# Patient Record
Sex: Female | Born: 1950 | Hispanic: No
Health system: Western US, Academic
[De-identification: ages and names within clinical notes are randomized; demographics above are authoritative.]

---

## 2018-06-10 ENCOUNTER — Ambulatory Visit: Admit: 2018-06-10 | Discharge: 2018-06-10 | Payer: MEDICAID | Attending: Audiologist

## 2018-06-10 ENCOUNTER — Ambulatory Visit
Admit: 2018-06-10 | Discharge: 2018-06-10 | Payer: MEDICAID | Attending: Student in an Organized Health Care Education/Training Program

## 2018-06-10 DIAGNOSIS — H8190 Unspecified disorder of vestibular function, unspecified ear: Secondary | ICD-10-CM

## 2018-06-10 DIAGNOSIS — H903 Sensorineural hearing loss, bilateral: Secondary | ICD-10-CM

## 2018-06-10 LAB — AUDBASE COMPREHENSIVE HEARING
Aoustic Reflex @1000Hz Probe L: 400
Aoustic Reflex @1000Hz Probe L: 95
Aoustic Reflex @1000Hz Probe R: 400
Aoustic Reflex @1000Hz Probe R: 85
Aoustic Reflex @2000Hz Probe L: 400
Aoustic Reflex @2000Hz Probe L: 95
Aoustic Reflex @2000Hz Probe R: 400
Aoustic Reflex @2000Hz Probe R: 90
Aoustic Reflex @500Hz Probe Le: 400
Aoustic Reflex @500Hz Probe Le: 95
Aoustic Reflex @500Hz Probe Ri: 400
Aoustic Reflex @500Hz Probe Ri: 95
Aoustic Reflex Decay @1000Hz P: 0
Aoustic Reflex Decay @1000Hz P: 0
Aoustic Reflex Decay @1000Hz P: 0
Aoustic Reflex Decay @1000Hz P: 0
Aoustic Reflex Decay @2000Hz P: 0
Aoustic Reflex Decay @2000Hz P: 0
Aoustic Reflex Decay @2000Hz P: 0
Aoustic Reflex Decay @2000Hz P: 0
Aoustic Reflex Decay @4000Hz P: 0
Aoustic Reflex Decay @4000Hz P: 0
Aoustic Reflex Decay @4000Hz P: 0
Aoustic Reflex Decay @4000Hz P: 0
Aoustic Reflex Decay @500Hz Pr: 0
Aoustic Reflex Decay @500Hz Pr: 0
Aoustic Reflex Decay @500Hz Pr: 0
Aoustic Reflex Decay @500Hz Pr: 0
Aoustic Reflex Decay @Noise Pr: 0
Aoustic Reflex Decay @Noise Pr: 0
Aoustic Reflex Decay @Noise Pr: 0
Aoustic Reflex Decay @Noise Pr: 0
Audiogram Date: 20191122
Audiogram Specific Frequency T: 0
Audiogram Time: 132448
Bne Unmasked Right @ 1000Hz: 15
Bne Unmasked Right @ 2000Hz: 15
Bne Unmasked Right @ 250Hz: 5
Bne Unmasked Right @ 4000Hz: 20
Bne Unmasked Right @ 500Hz: 15
Ear Canal Volume Left Ear: 1.284
Ear Canal Volume Right Ear: 1.234
Peak Admittance Left Ear: 0.716
Peak Admittance Right Ear: 0.622
Peak Pressure Left Ear: -1
Peak Pressure Right Ear: -1
Probe Tone Frequency Hz Left E: 226
Probe Tone Frequency Hz Right: 226
Pure Tone Air Unmasked Left @: 10
Pure Tone Air Unmasked Left @: 10
Pure Tone Air Unmasked Left @: 10
Pure Tone Air Unmasked Left @: 10
Pure Tone Air Unmasked Left @: 15
Pure Tone Air Unmasked Left @: 20
Pure Tone Air Unmasked Left @: 20
Pure Tone Air Unmasked Left @: 20
Pure Tone Air Unmasked Left @: 25
Pure Tone Air Unmasked Left: 20
Pure Tone Air Unmasked Right @: 10
Pure Tone Air Unmasked Right: 10
Pure Tone Air Unmasked Right: 10
Pure Tone Air Unmasked Right: 10
Pure Tone Air Unmasked Right: 10
Pure Tone Air Unmasked Right: 10
Pure Tone Air Unmasked Right: 15
Pure Tone Air Unmasked Right: 15
Pure Tone Air Unmasked Right: 20
Pure Tone Air Unmasked Right: 20
Pure Tone Average Air Left: 13
Pure Tone Average Air Right: 0
Pure Tone Average Air Right: 13
Pure Tone Average Bne Right: 15
Pure Tone Bne Unmasked Left @: 15
Pure Tone Bne Unmasked Left @: 15
Pure Tone Bne Unmasked Left @: 15
Pure Tone Bne Unmasked Left @: 20
Pure Tone Bne Unmasked Left @: 5
Pure Tone Tinnitus Matching Fr: 0
Pure Tone Tinnitus Matching Fr: 0
SRT Air Left: 20
SRT Air Right: 25
Speech Test Date: 20191122
Speech Test Time: 132449
Tymp Grdnt Left Ear: 80
Tymp Grdnt Right Ear: 105
Tympanogram Date: 20191122
Tympanogram Time: 132448
Word Rec Left Trial 1 Percenta: 100
Word Rec Left Trial 1 dB Level: 65
Word Rec Right Trial 1 Percent: 100
Word Rec Right Trial 1 dB Leve: 65
~~LOC~~ AUD PURETONEAVG_AIR_L_FRE: 0
~~LOC~~ AUD PURETONEAVG_BNE_L_FRE: 0
~~LOC~~ AUD PURETONEAVG_BNE_R_FRE: 0
~~LOC~~ AUD PURETONEAVG_SFAID_FRE: 0
~~LOC~~ AUD PURETONEAVG_SFUNAID_F: 0

## 2018-06-10 NOTE — Progress Notes (Signed)
Adult Audiological Evaluation    Primary Physician: Rocky CraftsMimi Y Ogawa, MD  Referring Physician: Aniceto BossMacDonald, Michael Rain*   Diagnosis: Sensorineural hearing loss (SNHL), bilateral  Date of Service: 06/10/2018  Name of Provider: Ollen BargesBrooke I Lewandowski, AUD    HISTORY  Family Members/Caregivers/Associates in Attendance: none  Interpreter: Portugese Alonza Bogusibelli Pacheco     Primary Complaint: Gail Andrews, age 67, was seen today for a hearing evaluation in conjunction with [facility]. Her dizziness episodes occur about once per month in the mornings when moving her head up and down, which can cause difficulty walking. She describes the dizziness as her head spinning and becomes very sound sensitive during episodes, which last hours-days and can occur multiple days in a row.    Additional otologic history is as follows:  Previous Hearing Evaluation: No  Noise exposure: No  Ear/infections/drainage: No  Ear fullness Pressure: No  Ear Pain: 0  Ear Surgery: No  Tinnitus: No  Family History of Hearing Loss: No  Hearing Aid Experience: No  Communication impact: No  Headache: No    RESULTS:  Otoscopic Evaluation:         Right Ear:  TM visualized and unremarkable  Left Ear:  TM visualized and unremarkable         Audiologic Evaluation: obtained today using insert earphones, retested with TDH headphones (see attached audiogram):   Right Ear: hearing sensitivity within normal to borderline normal limits from 250Hz -8kHz   Left Ear: hearing sensitivity within normal to borderline normal limits from 250Hz -8kHz    Speech Reception Threshold (SRT):           Right Ear: 25 dBHL  Left Ear: 20 dBHL    Speech Recognition:       Right Ear: 100% at 65 dBHL, indicating excellent word recognition scores  Left Ear: 100% at 65 dBHL, indicating excellent word recognition scores    IMMITTANCE:  Tympanometry  Right Ear: revealed normal ear canal volume, pressure and compliance consistent with normal  eardrum mobility.      Left Ear: revealed normal ear canal volume, pressure and compliance consistent with normal eardrum mobility.         Acoustic Reflexes: Ipsilaterally stimulated acoustic reflexes were present from [phone] Hz for right and left ears.  This is consistent with audiometric results. ART was completed using handheld probe tip. CNT contralateral thresholds due to poor acoustic seal and excessive needle artifact.    IMPRESSIONS:  Patient was seen today for a hearing evaluation in conjunction with [facility]. Audiometric results revealed hearing sensitivity within normal to borderline normal limits from 250Hz -8kHz in both ears. Word recognition is excellent bilaterally. Tympanometry is consistent with normal TM mobility in both ears. Acoustic reflex thresholds are consistent with audiometric results.    RECOMMENDATIONS:  1. Follow up with referring physician  2. Re-evaluation of hearing in 1-2 years to monitor progression of hearing.     Ollen BargesBrooke I Lewandowski, Au.D. CCC-A  Audiologist

## 2018-06-10 NOTE — Progress Notes (Signed)
VESTIBULAR / BALANCE EVALUATION    Date of Service: 06/10/2018  Referring MD: Aniceto BossMacDonald, Michael Rain*  Name of Provider: Konrad Felixachel A Smith, AUD     Name of Patient: Gail Andrews   Patient Date of Birth: 01/17/1951  Patient MRN: [id number]     Pt accompanied by:  Daughter   Primary language:  Portuguese   Interpreter: Hillary Bowibelli      HISTORY:  Gail CoolRosalina Andrews was [facility] in the Division of Audiology for a vestibular evaluation.     Problem: Dizziness   Date of onset: ~5 years ago       Symptoms: Patient reports episodes of dizziness that occurs about once a month in the mornings. The dizziness occurs when moving her head up and down and she experiences difficulty walking. She describes the dizziness as her head spinning and becomes very sound sensitive during episodes. She must lay in bed and it is difficult to move around. The spinning lasts hours to days and can occur multiple days in a row. She endorses nausea during episodes.       Duration: Days   Frequency: Once a month to every two weeks   In between episodes: Normal   True vertigo: Yes   Hx of falls: Yes     Precipitating factors: Turning either to the right or left, looking down or up, stress   Alleviates symptoms: Rest     Migraine/Headache: Headaches during dizziness episodes     Photophobia/Phonophobia: Sensitivity to sound during episodes   Motion Sensitivity: No     Previous treatments:  Meclizine, helps her to feel better but takes time to work      Hearing loss: No   Tinnitus: No   Aural Pressure: No     DIZZINESS HANDICAP INVENTORY:   The Dizziness Handicap Inventory [facility](DHI) is a useful measure designed to evaluate the self-perceived handicapping effects imposed by a patient's dizziness. Ms. Gail AbrahamsSouza scored a 7284 on the DHI, suggesting a severe handicap.       AUDIOLOGIC EVALUATION: 06/10/18 hearing sensitivity within normal limits bilaterally with excellent WRS bilaterally. Tympanometry was consistent with normal tympanic membrane  mobility bilaterally. Present ipsilateral acoustic reflexes bilaterally.        VESTIBULAR RESULTS: See below for test details. Vestibular data available upon request, or can be viewed in SCANNED DOCUMENTS.      IMPRESSIONS:  1. PERIPHERAL: Normal VNG, vHIT, and rotary chair. No objective indications of peripheral vestibular system involvement noted.  2. CENTRAL: Abnormal visual pursuit, saccades, optokinetics, and abnormal suppression during calorics are central indications. Of note, these results may be affected by attention to task and peripheral vision. No other indications of central vestibulo-ocular pathway involvement noted.   3. BPPV: There were no objective indications for Benign Paroxysmal Positional Vertigo.      RECOMMENDATIONS:  1. Results to referring physician for review and further recommendations.  2. Patient's history of headaches and phonophobia during episodes cannot exclude the possibility of vestibular migraine as a contributing factor to Gail Andrews's dizziness. The patient was encouraged to discuss this further with the referring physician.       Todays results and treatment plan were discussed with the patient with regard to their specific complaints. The patient was given the opportunity to ask questions and was answered appropriately.         Alene Miresachel Smith, Au.D.  Clinical Audiologist       VESTIBULAR SUBTEST RESULTS:    Normal Abnormal Comments  GAZE W/ FIXATION X     GAZE W/O FIXATION X     SACCADES  X Saccadic   VISUAL PURSUIT  X Abnormal velocity and accuracy    OPTOKINETICS  X    HEAD SHAKE X     DIX-HALLPIKE TESTS:    Right X       Left X     CANALITH REPOSITIONING PROCEDURES: None performed     POSITIONS (STATIC) with Vision Removed:   Normal Abnormal      Supine X       Head right (Roll test) X       Head left (Roll test) X       30 degree incline  X     CALORIC Testing   Bithermal *X  Unilateral Weakness = 5% Left weakness, which is not clinically significant     Total Right Ear  SPV: 33 deg/sec  Total Left Ear SPV: 30 deg/sec    Right Warm: 19  Left Warm: 17  Right Andrews: 14  Left Andrews: 13    *Abnormal fixation in all conditions.    Sensory Organization and Performance (SOP)  X Standing, eyes open: Normal  Standing, eyes closed: Normal  Tandem, eyes open: Normal  Tandem, eyes closed: Normal  Foam, eyes open: Sway  Foam, eyes closed: Fall   RB = right beating nystagmus   UB = up beating nystagmus  LB = left beating nystagmus    DB = down beating nystagmus  SWV = suppressed with vision      ROTARY CHAIR:   Normal Abnormal Comments   SINUSOIDALS      Gain X     Phase X     Symmetry X     Visual Enhancement X     Visual Suppression X     TRAPEZOIDALS        100 dps              Gain X  Avg rightward: 0.63  Avg leftward: 0.74   Time constant  (sec) X  Avg rightward: 13.3  Avg leftward: 14.9     Video Head Impulse Testing (vHIT):   Normal Abnormal Comments   Gain      Right Lateral X     Left Lateral X     Right Anterior X     Left Posterior X     Right Posterior X     Left Anterior X

## 2023-09-21 IMAGING — MR Cranio Encefalo
10 of 12 series · 34 of 48 positions shown · non-contrast
Comparison: none

[Series 5001: t1_se_sag_p3 · sagittal · 5.0mm · 0.45mm/px · 3 of 25 slices shown]
[im 1/25]
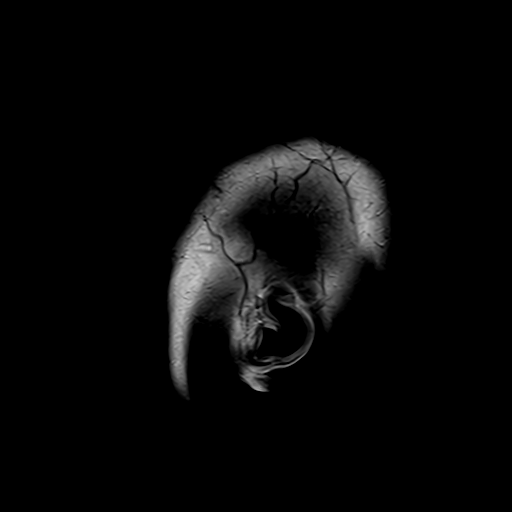
[im 13/25]
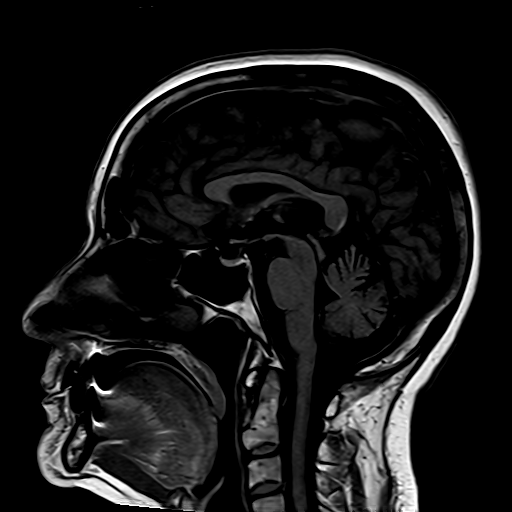
[im 25/25]
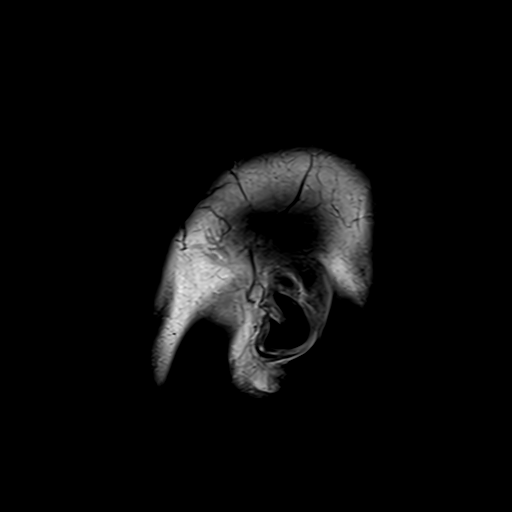

[Series 6001: axial_t2_flair · axial · 5.5mm · 0.45mm/px · z∈[-64,+81]mm · 3 of 25 slices shown]
[im 1/25]
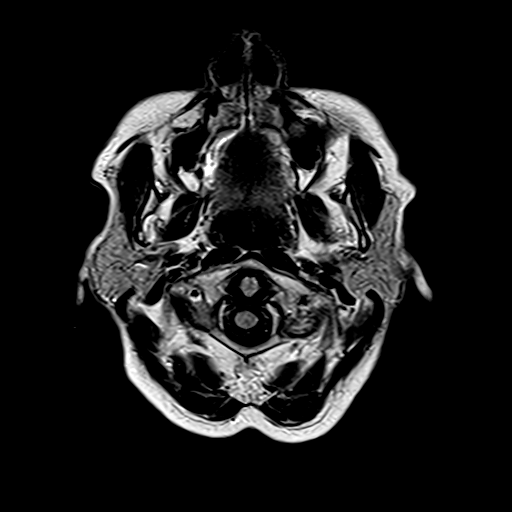
[im 13/25]
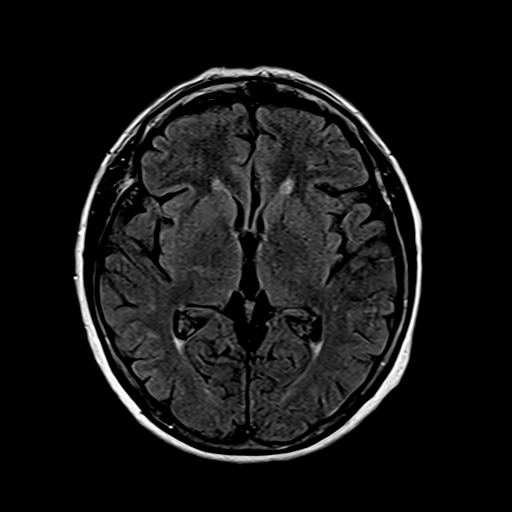
[im 25/25]
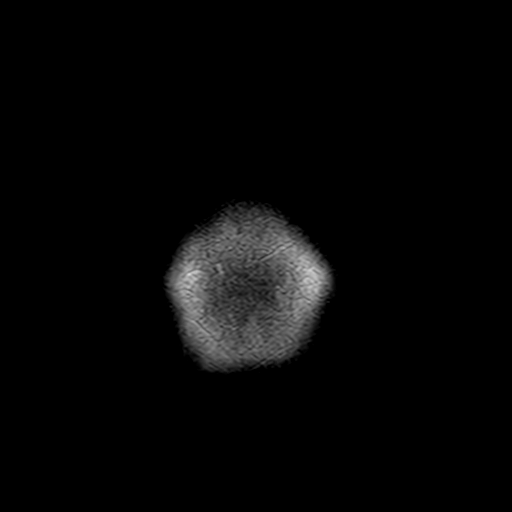

[Series 7001: T2 · axial · 5.5mm · 0.36mm/px · z∈[-64,+80]mm · 3 of 25 slices shown (1 of 2)]
[im 1/25]
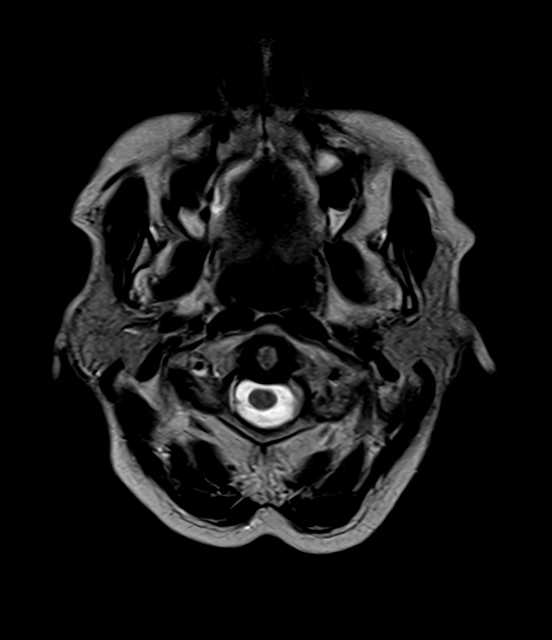
[im 13/25]
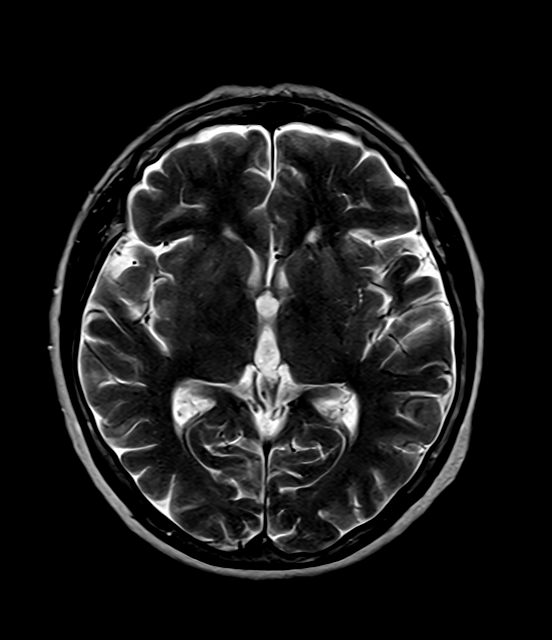
[im 25/25]
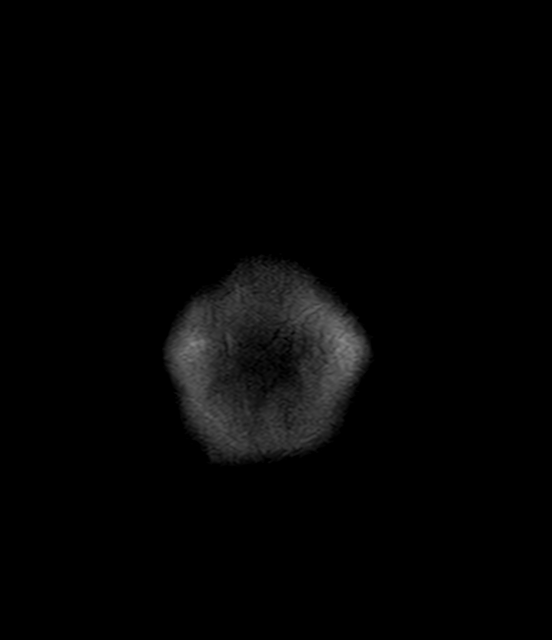

[Series 8001: axial_t1 · axial · 5.5mm · 0.51mm/px · z∈[-64,+80]mm · 3 of 25 slices shown]
[im 1/25]
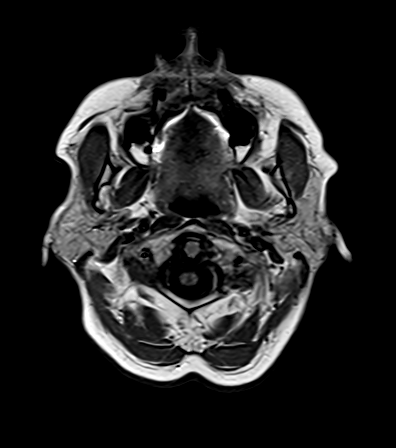
[im 13/25]
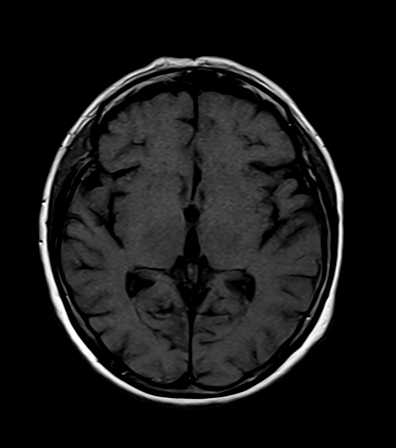
[im 25/25]
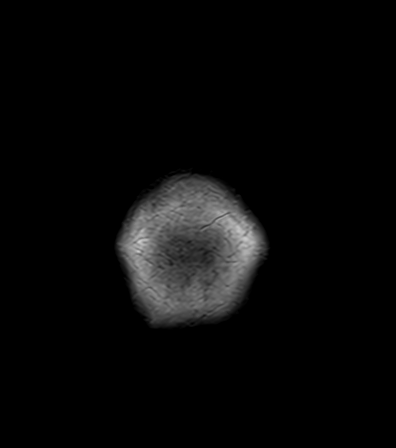

[Series 9001: difusao_ep2d_(id)_trace_p2_tracew · axial · 5.5mm · 1.57mm/px · z∈[-64,+81]mm · 2 of 25 slices shown (1 of 2)]
[im 1/25]
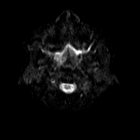
[im 25/25]
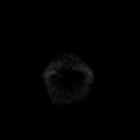

[Series 9002: difusao_ep2d_(id)_trace_p2_tracew · axial · 5.5mm · 1.57mm/px · z∈[-64,+81]mm · 2 of 25 slices shown (2 of 2)]
[im 1/25]
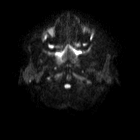
[im 25/25]
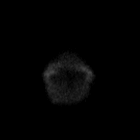

[difusao_ep2d_(id)_trace_p2_adc · axial · 5.5mm · 1.57mm/px · z∈[-64,+81]mm · 2 of 25 slices shown]
[im 1/25]
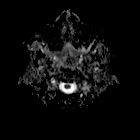
[im 25/25]
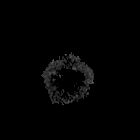

[axial_t2_swi_mag · axial · 1.9mm · 1.02mm/px · z∈[-65,+82]mm · 7 of 80 slices shown]
[im 1/80]
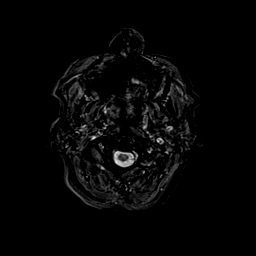
[im 14/80]
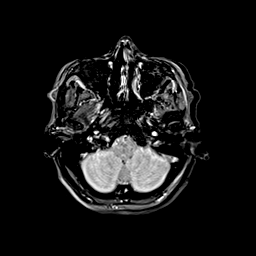
[im 27/80]
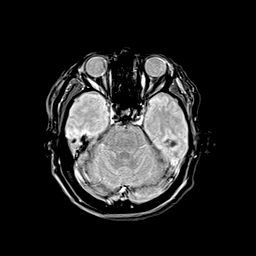
[im 40/80]
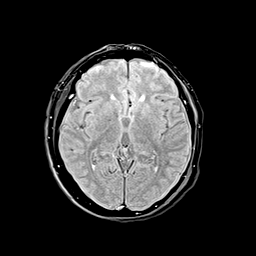
[im 53/80]
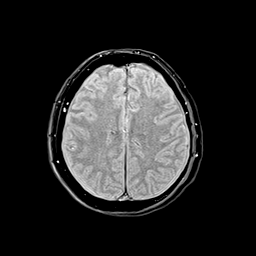
[im 66/80]
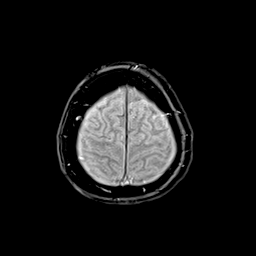
[im 80/80]
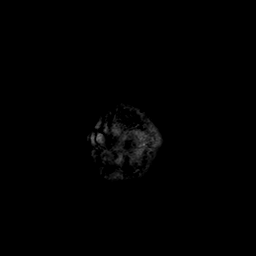

[axial_t2_swi_pha · axial · 1.9mm · 1.02mm/px · z∈[-65,+82]mm · 7 of 80 slices shown]
[im 1/80]
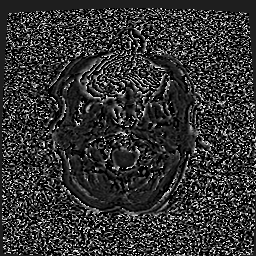
[im 14/80]
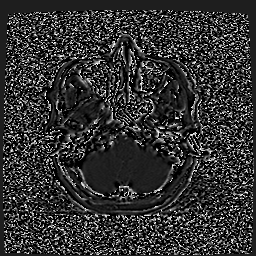
[im 27/80]
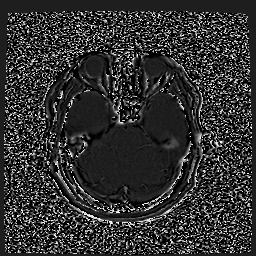
[im 40/80]
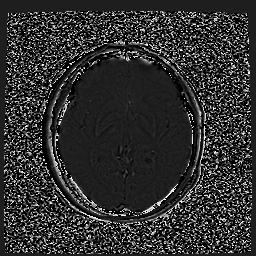
[im 53/80]
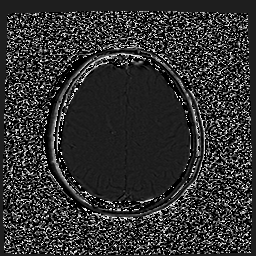
[im 66/80]
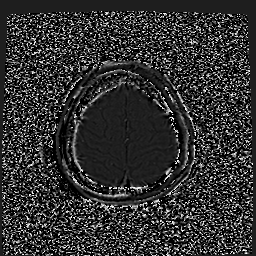
[im 80/80]
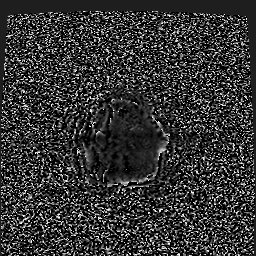

[T2 · coronal · 4.0mm · 0.36mm/px · 2 of 25 slices shown (2 of 2)]
[im 1/25]
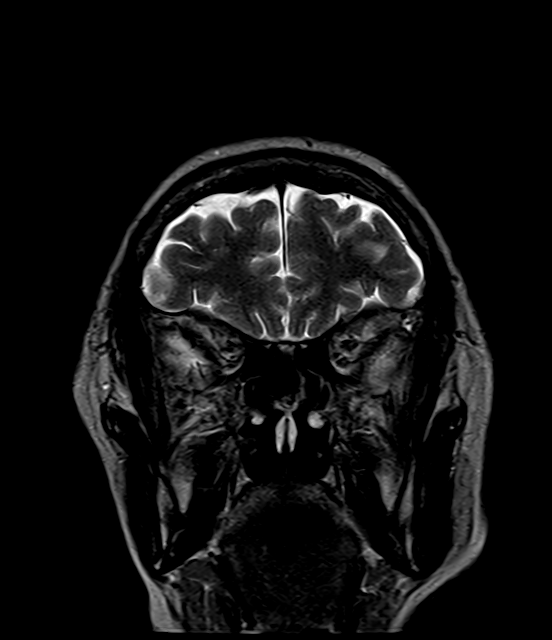
[im 25/25]
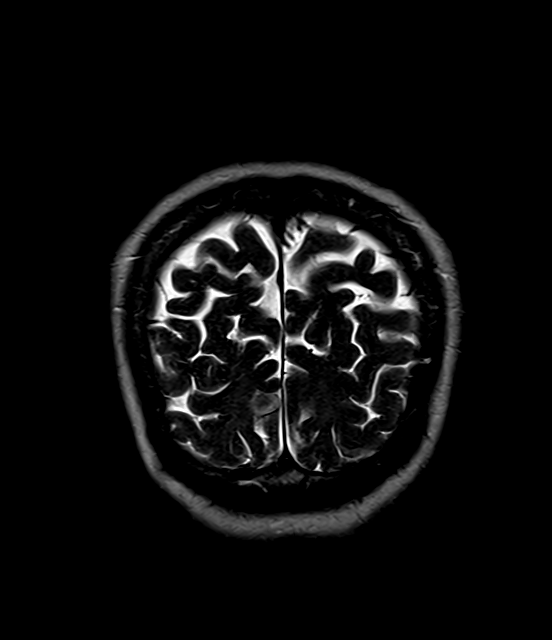

[34 of 48 positions shown; findings below may reference images not displayed]

RESSONÂNCIA MAGNÉTICA DO CRÂNIO

INDICAÇÃO: prejuízo de memória e vertigem.
TÉCNICA:
Exame realizado em equipamento de ressonância magnética com sequências, ponderações e planos específicos para o segmento de interesse, sem a administração endovenosa do meio de contraste.

RESULTADO:
Discreta redução volumétrica encefálica difusa com dilatação compensatória do espaço subaracnoideo.
Raros diminutos focos de hipersinal em T2 distribuídos pela substância branca supratentorial, sem determinar efeito atrófico ou expansivo significativo, sugerindo gliose por microangiopatia.
O restante do parênquima cerebral tem intensidade de sinal normal.
As formações hipocampais, núcleos amigdaloides, colunas dos fórnices e corpos mamilares são simétricos, com aspectos anatômicos e sinal normal, notando-se alargamento isolado da fissura coroideia, pela avaliação qualitativa (escala de Shun Fai MTA=1)..
Ausência de hidrocefalia hipertensiva.
Tronco cerebral e cerebelo apresentando forma e intensidade de sinal normal.
Não há sinais de processo expansivo intracraniano.
Corpo caloso de morfologia e espessuras normais.
A sequência eco-planar não evidencia focos de restrição à difusão passiva de água.
Sinais de sela túrcica parcialmente vazia.

CONCLUSÃO:
Leve redução volumétrica encefálica difusa, habitual para a faixa etária.
Provável gliose por microangiopatia incipiente na substância branca supratentorial.
Importante correlação clínica.
